# Patient Record
Sex: Male | Born: 1995 | Race: White | Hispanic: No | Marital: Married | Smoking: Never smoker
Health system: Southern US, Community
[De-identification: ages and names within clinical notes are randomized; demographics above are authoritative.]

## PROBLEM LIST (undated history)

## (undated) DIAGNOSIS — R0789 Other chest pain: Secondary | ICD-10-CM

## (undated) DIAGNOSIS — R202 Paresthesia of skin: Secondary | ICD-10-CM

## (undated) DIAGNOSIS — E669 Obesity, unspecified: Secondary | ICD-10-CM

## (undated) DIAGNOSIS — R7989 Other specified abnormal findings of blood chemistry: Secondary | ICD-10-CM

## (undated) HISTORY — DX: Obesity, unspecified: E66.9

## (undated) HISTORY — DX: Other chest pain: R07.89

## (undated) HISTORY — DX: Other specified abnormal findings of blood chemistry: R79.89

## (undated) HISTORY — DX: Paresthesia of skin: R20.2

---
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
## 2016-07-30 ENCOUNTER — Encounter: Payer: Self-pay | Admitting: Nurse Practitioner

## 2016-07-30 ENCOUNTER — Ambulatory Visit (INDEPENDENT_AMBULATORY_CARE_PROVIDER_SITE_OTHER): Payer: BC Managed Care – PPO | Admitting: Nurse Practitioner

## 2016-07-30 VITALS — BP 106/72 | Temp 98.7°F | Ht 71.0 in | Wt 235.1 lb

## 2016-07-30 DIAGNOSIS — J020 Streptococcal pharyngitis: Secondary | ICD-10-CM

## 2016-07-30 DIAGNOSIS — J029 Acute pharyngitis, unspecified: Secondary | ICD-10-CM

## 2016-07-30 LAB — POCT RAPID STREP A (OFFICE): RAPID STREP A SCREEN: POSITIVE — AB

## 2016-07-30 MED ORDER — AZITHROMYCIN 250 MG PO TABS: ORAL_TABLET | ORAL | 0 refills | Status: DC

## 2016-07-31 ENCOUNTER — Encounter: Payer: Self-pay | Admitting: Nurse Practitioner

## 2016-07-31 NOTE — Progress Notes (Signed)
Subjective:  Presents complaints of fever headache sore throat and swollen lymph nodes that began 2 days ago. Low-grade fever. Generalized headache. Minimal cough. No wheezing. No ear pain. Nausea, no vomiting or diarrhea. No abdominal pain, rare "tight feeling" in the left upper abdomen. Has felt knots in the right side of the neck, questions whether these are lymph nodes. No rash.  Objective:   BP 106/72   Temp 98.7 F (37.1 C) (Oral)   Ht 5\' 11"  (1.803 m)   Wt 235 lb 2 oz (106.7 kg)   BMI 32.79 kg/m  NAD. Alert, oriented. TMs minimal clear effusion, no erythema. Pharynx mild erythema. Neck supple with mild soft anterior adenopathy, 3 small soft mobile posterior cervical lymph nodes noted right side. Heart regular rate rhythm. Abdomen soft nondistended nontender without obvious organomegaly/splenomegaly. Results for orders placed or performed in visit on 07/30/16  POCT rapid strep A  Result Value Ref Range   Rapid Strep A Screen Positive (A) Negative     Assessment: Sore throat - Plan: POCT rapid strep A  Strep pharyngitis  Plan:  Meds ordered this encounter  Medications  . azithromycin (ZITHROMAX Z-PAK) 250 MG tablet    Sig: Take 2 tablets (500 mg) on  Day 1,  followed by 1 tablet (250 mg) once daily on Days 2 through 5.    Dispense:  6 each    Refill:  0    Order Specific Question:   Supervising Provider    Answer:   Merlyn Albert [[id number]]   Review warning signs. Call back in 72 hours if no improvement, sooner if worse. If symptoms persist, recommend testing for mono.

## 2017-07-17 ENCOUNTER — Ambulatory Visit (INDEPENDENT_AMBULATORY_CARE_PROVIDER_SITE_OTHER): Payer: BC Managed Care – PPO | Admitting: Nurse Practitioner

## 2017-07-17 ENCOUNTER — Ambulatory Visit: Payer: BC Managed Care – PPO | Admitting: Nurse Practitioner

## 2017-07-17 ENCOUNTER — Encounter: Payer: Self-pay | Admitting: Nurse Practitioner

## 2017-07-17 VITALS — BP 126/82 | HR 91 | Temp 99.7°F | Ht 71.0 in | Wt 245.1 lb

## 2017-07-17 DIAGNOSIS — K219 Gastro-esophageal reflux disease without esophagitis: Secondary | ICD-10-CM | POA: Diagnosis not present

## 2017-07-17 DIAGNOSIS — M7522 Bicipital tendinitis, left shoulder: Secondary | ICD-10-CM

## 2017-07-17 DIAGNOSIS — R079 Chest pain, unspecified: Secondary | ICD-10-CM | POA: Diagnosis not present

## 2017-07-17 MED ORDER — RANITIDINE HCL 300 MG PO TABS: ORAL_TABLET | ORAL | 5 refills | Status: DC

## 2017-07-17 NOTE — Progress Notes (Signed)
Subjective:  Presents for complaints of mid chest wall pain off-and-on for the past month. Occurs about 10 times per week. No specific pattern. Describes as a dull tightness with occasional sharp pain usually localized to the right mid lower chest wall rarely radiates over towards the left axillary area. Can last anywhere from 1:50 hours. Unassociated with meals or activity. Has not identified any specific triggers. No shortness of breath. No nausea vomiting. Has had some reflux and epigastric area pain at times. Minimal caffeine intake. Denies any form of tobacco use. No alcohol intake. No excessive NSAID use. No known family history of MI or heart disease. No relief with Tums. Has been doing normal activities without any chest discomfort or shortness of breath. [place] the zoo a few days ago and did tremendous walking without difficulty. Also complaints of tingling along the left arm from just above the elbow to the hand. No specific history of injury. Notices it more when he sitting in a car with his arm on the armrest for a length of time. Some relief with ibuprofen. No coughing. No edema. No fever.  Objective:   BP 126/82   Pulse 91   Temp 99.7 F (37.6 C) (Oral)   Ht 5\' 11"  (1.803 m)   Wt 245 lb 0.8 oz (111.2 kg)   SpO2 97%   BMI 34.18 kg/m  NAD. Alert, oriented. Lungs clear. Heart regular rate rhythm. No murmur or gallop noted. Abdomen soft nondistended nontender. No tenderness with palpation of the chest wall. EKG normal. Patient is not having discomfort at this time. Lower extremities no edema.  Assessment:   Problem List Items Addressed This Visit      Digestive   Gastroesophageal reflux disease without esophagitis   Relevant Medications   ranitidine (ZANTAC) 300 MG tablet    Other Visit Diagnoses    Chest pain, unspecified type    -  Primary   Biceps tendinitis of left shoulder           Plan:   Meds ordered this encounter  Medications  . ranitidine (ZANTAC) 300 MG tablet     Sig: One po qd for acid reflux    Dispense:  30 tablet    Refill:  5    Order Specific Question:   Supervising Provider    Answer:   Merlyn AlbertLUKING, WILLIAM S [[id number]]   Trial of Zantac once a day. Given written and verbal information on lifestyle factors affecting reflux disease. Warning signs reviewed including signs of ischemic heart disease. Patient to seek help immediately if symptoms worsen or change. Given copy of shoulder exercises. Use anti-inflammatory medication sparingly. Return in about 3 weeks (around 08/07/2017) for recheck.

## 2017-07-17 NOTE — Patient Instructions (Addendum)
Food Choices for Gastroesophageal Reflux Disease, Adult When you have gastroesophageal reflux disease (GERD), the foods you eat and your eating habits are very important. Choosing the right foods can help ease the discomfort of GERD. Consider working with a diet and nutrition specialist (dietitian) to help you make healthy food choices. What general guidelines should I follow? Eating plan  Choose healthy foods low in fat, such as fruits, vegetables, whole grains, low-fat dairy products, and lean meat, fish, and poultry.  Eat frequent, small meals instead of three large meals each day. Eat your meals slowly, in a relaxed setting. Avoid bending over or lying down until 2-3 hours after eating.  Limit high-fat foods such as fatty meats or fried foods.  Limit your intake of oils, butter, and shortening to less than 8 teaspoons each day.  Avoid the following: ? Foods that cause symptoms. These may be different for different people. Keep a food diary to keep track of foods that cause symptoms. ? Alcohol. ? Drinking large amounts of liquid with meals. ? Eating meals during the 2-3 hours before bed.  Cook foods using methods other than frying. This may include baking, grilling, or broiling. Lifestyle   Maintain a healthy weight. Ask your health care provider what weight is healthy for you. If you need to lose weight, work with your health care provider to do so safely.  Exercise for at least 30 minutes on 5 or more days each week, or as told by your health care provider.  Avoid wearing clothes that fit tightly around your waist and chest.  Do not use any products that contain nicotine or tobacco, such as cigarettes and e-cigarettes. If you need help quitting, ask your health care provider.  Sleep with the head of your bed raised. Use a wedge under the mattress or blocks under the bed frame to raise the head of the bed. What foods are not recommended? The items listed may not be a complete  list. Talk with your dietitian about what dietary choices are best for you. Grains Pastries or quick breads with added fat. French toast. Vegetables Deep fried vegetables. French fries. Any vegetables prepared with added fat. Any vegetables that cause symptoms. For some people this may include tomatoes and tomato products, chili peppers, onions and garlic, and horseradish. Fruits Any fruits prepared with added fat. Any fruits that cause symptoms. For some people this may include citrus fruits, such as oranges, grapefruit, pineapple, and lemons. Meats and other protein foods High-fat meats, such as fatty beef or pork, hot dogs, ribs, ham, sausage, salami and bacon. Fried meat or protein, including fried fish and fried chicken. Nuts and nut butters. Dairy Whole milk and chocolate milk. Sour cream. Cream. Ice cream. Cream cheese. Milk shakes. Beverages Coffee and tea, with or without caffeine. Carbonated beverages. Sodas. Energy drinks. Fruit juice made with acidic fruits (such as orange or grapefruit). Tomato juice. Alcoholic drinks. Fats and oils Butter. Margarine. Shortening. Ghee. Sweets and desserts Chocolate and cocoa. Donuts. Seasoning and other foods Pepper. Peppermint and spearmint. Any condiments, herbs, or seasonings that cause symptoms. For some people, this may include curry, hot sauce, or vinegar-based salad dressings. Summary  When you have gastroesophageal reflux disease (GERD), food and lifestyle choices are very important to help ease the discomfort of GERD.  Eat frequent, small meals instead of three large meals each day. Eat your meals slowly, in a relaxed setting. Avoid bending over or lying down until 2-3 hours after eating.  Limit high-fat   foods such as fatty meat or fried foods. This information is not intended to replace advice given to you by your health care provider. Make sure you discuss any questions you have with your health care provider. Document Released:  12/17/2005 Document Revised: 12/18/2016 Document Reviewed: 12/18/2016 Elsevier Interactive Patient Education  2017 Else[employer].  

## 2017-07-18 ENCOUNTER — Encounter: Payer: Self-pay | Admitting: Family Medicine

## 2017-08-09 ENCOUNTER — Encounter: Payer: Self-pay | Admitting: Nurse Practitioner

## 2017-08-09 ENCOUNTER — Ambulatory Visit (INDEPENDENT_AMBULATORY_CARE_PROVIDER_SITE_OTHER): Payer: BC Managed Care – PPO | Admitting: Nurse Practitioner

## 2017-08-09 VITALS — BP 118/80 | Ht 71.0 in | Wt 245.5 lb

## 2017-08-09 DIAGNOSIS — Z91018 Allergy to other foods: Secondary | ICD-10-CM | POA: Diagnosis not present

## 2017-08-09 DIAGNOSIS — K219 Gastro-esophageal reflux disease without esophagitis: Secondary | ICD-10-CM

## 2017-08-09 MED ORDER — PANTOPRAZOLE SODIUM 40 MG PO TBEC: 40.0000 mg | DELAYED_RELEASE_TABLET | Freq: Every day | ORAL | 2 refills | Status: DC

## 2017-08-09 NOTE — Progress Notes (Signed)
Subjective:  Presents for recheck on his chest pain see previous note. Symptoms overall have improved with less frequency. Last flareup was 3 days ago. No abdominal pain or reflux. Nonsmoker. Social alcohol use. Does drink coffee. No shortness of breath nausea or diaphoresis. Unassociated with activity. Unassociated with meals or particular foods as far as he can tell. No fever. No cough. No known history of tick bites or tick disease symptoms.  Objective:   BP 118/80   Ht $RemoveBefore [id number]$5\' 11" kg/m  NAD. Alert, oriented. Lungs clear. Heart regular rate rhythm. Abdomen soft nondistended with active bowel sounds 4; nontender to palpation. No obvious masses. No chest wall tenderness to palpation.  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 acetaminophen (TYLENOL) 500 MG tablet    Sig: Take 500 mg by mouth every 6 (six) hours as needed.  . pantoprazole (PROTONIX) 40 MG tablet    Sig: Take 1 tablet (40 mg total) by mouth daily.    Dispense:  30 tablet    Refill:  2    Order Specific Question:   Supervising Provider    Answer:   Merlyn AlbertLUKING, WILLIAM S [[id number]]   Hold on Zantac. Switch to pantoprazole for the next 4-6 weeks. Call back at that time if symptoms persist, sooner if worse. Lab test pending.

## 2017-08-14 LAB — ALPHA-GAL PANEL
BEEF (BOS SPP) IGE: 0.12 kU/L (ref <0.35)
Class Interpretation: 0
LAMB CLASS INTERPRETATION: 0
Lamb/Mutton (Ovis spp) IgE: <0.1 kU/L (ref <0.35)

## 2017-08-17 ENCOUNTER — Encounter: Payer: Self-pay | Admitting: Nurse Practitioner

## 2019-11-25 ENCOUNTER — Encounter (HOSPITAL_COMMUNITY): Payer: Self-pay

## 2019-11-25 ENCOUNTER — Ambulatory Visit (INDEPENDENT_AMBULATORY_CARE_PROVIDER_SITE_OTHER): Payer: BC Managed Care – PPO

## 2019-11-25 ENCOUNTER — Other Ambulatory Visit: Payer: Self-pay

## 2019-11-25 ENCOUNTER — Telehealth (HOSPITAL_COMMUNITY): Payer: Self-pay

## 2019-11-25 ENCOUNTER — Ambulatory Visit (HOSPITAL_COMMUNITY)
Admission: EM | Admit: 2019-11-25 | Discharge: 2019-11-25 | Disposition: A | Discharge status: Home / Self Care | Payer: BC Managed Care – PPO | Attending: Family Medicine | Admitting: Family Medicine

## 2019-11-25 DIAGNOSIS — R0981 Nasal congestion: Secondary | ICD-10-CM | POA: Diagnosis not present

## 2019-11-25 DIAGNOSIS — J069 Acute upper respiratory infection, unspecified: Secondary | ICD-10-CM

## 2019-11-25 DIAGNOSIS — R509 Fever, unspecified: Secondary | ICD-10-CM | POA: Insufficient documentation

## 2019-11-25 DIAGNOSIS — R05 Cough: Secondary | ICD-10-CM | POA: Insufficient documentation

## 2019-11-25 DIAGNOSIS — U071 COVID-19: Secondary | ICD-10-CM | POA: Diagnosis not present

## 2019-11-25 MED ORDER — HYDROCODONE-HOMATROPINE 5-1.5 MG/5ML PO SYRP: 5.0000 mL | ORAL_SOLUTION | Freq: Four times a day (QID) | ORAL | 0 refills | Status: DC | PRN

## 2019-11-25 NOTE — ED Provider Notes (Signed)
[facility]   [id number] 11/25/19 Arrival Time: 1609  ASSESSMENT & PLAN:  1. Viral URI with cough     I have personally viewed the imaging studies ordered this visit. No signs of pneumonia.  As needed: Meds ordered this encounter  Medications  . HYDROcodone-homatropine (HYCODAN) 5-1.5 MG/5ML syrup    Sig: Take 5 mLs by mouth every 6 (six) hours as needed for cough.    Dispense:  90 mL    Refill:  0    COVID testing sent. Discussed typical duration of symptoms. OTC symptom care as needed. Ensure adequate fluid intake and rest. May f/u with PCP or here as needed.  Reviewed expectations re: course of current medical issues. Questions answered. Outlined signs and symptoms indicating need for more acute intervention. Patient verbalized understanding. After Visit Summary given.   SUBJECTIVE: History from: patient.  Chase Hendricks is a 24 y.o. male who presents with complaint of nasal congestion, a persistent dry cough; without sore throat. Onset abrupt, 1.5 w ago; without fatigue and without body aches. SOB: none. Wheezing: none. Fever: initially; none recently. Overall normal PO intake without n/v. Known sick contacts or COVID-19 exposure: none. Tested negative for COVID approx 6 d ago. No specific or significant aggravating or alleviating factors reported. OTC treatment: NyQuil; mild relief. Cough does keep him up at night..   Social History   Tobacco Use  Smoking Status Never Smoker  Smokeless Tobacco Never Used    ROS: As per HPI. All other systems negative.    OBJECTIVE:  Vitals:   11/25/19 1647  BP: 118/81  Pulse: (!) 105  Resp: 18  Temp: 99.3 F (37.4 C)  TempSrc: Oral  SpO2: 97%    Recheck pulse: 96  General appearance: alert; appears fatigued HEENT: nasal congestion; clear runny nose; throat irritation secondary to post-nasal drainage Neck: supple without LAD CV: RRR Lungs: unlabored respirations, symmetrical air entry without  wheezing; cough: mild and dry Abd: soft Ext: no LE edema Skin: warm and dry Psychological: alert and cooperative; normal mood and affect  Imaging: Dg Chest 2 View  Result Date: 11/25/2019 CLINICAL DATA:  Fever and cough.  COVID negative. EXAM: CHEST - 2 VIEW COMPARISON:  Chest x-ray dated 09/24/2007 FINDINGS: The heart size and mediastinal contours are within normal limits. Both lungs are clear. The visualized skeletal structures are unremarkable. IMPRESSION: Normal exam. Electronically Signed   By: Francene Boyers M.D.   On: 11/25/2019 17:40    No Known Allergies  PMH: GERD  FH: HTN  Social History   Socioeconomic History  . Marital status: Single    Spouse name: Not on file  . Number of children: Not on file  . Years of education: Not on file  . Highest education level: Not on file  Occupational History  . Not on file  Social Needs  . Financial resource strain: Not on file  . Food insecurity    Worry: Not on file    Inability: Not on file  . Transportation needs    Medical: Not on file    Non-medical: Not on file  Tobacco Use  . Smoking status: Never Smoker  . Smokeless tobacco: Never Used  Substance and Sexual Activity  . Alcohol use: Yes    Comment: rare social  . Drug use: No  . Sexual activity: Never  Lifestyle  . Physical activity    Days per week: Not on file    Minutes per session: Not on file  .  Stress: Not on file  Relationships  . Social Herbalist on phone: Not on file    Gets together: Not on file    Attends religious service: Not on file    Active member of club or organization: Not on file    Attends meetings of clubs or organizations: Not on file    Relationship status: Not on file  . Intimate partner violence    Fear of current or ex partner: Not on file    Emotionally abused: Not on file    Physically abused: Not on file    Forced sexual activity: Not on file  Other Topics Concern  . Not on file  Social History Narrative  .  Not on file           Vanessa Kick, MD 11/25/19 [phone]

## 2019-11-25 NOTE — ED Triage Notes (Signed)
Pt states having cough, nasal congestion, fever, chills, headache x 1 week and a half. Pt report he tested negative for Covid on 11/19/2019

## 2019-11-25 NOTE — Discharge Instructions (Signed)
If your Covid-19 test is positive, you will get a phone call from [facility] regarding your results. If your Covid-19 test is negative, you will NOT get a phone call from [facility] with your results. You may view your results on MyChart. If you do not have a MyChart account, sign up instructions are in your discharge papers. ° °

## 2019-11-27 LAB — NOVEL CORONAVIRUS, NAA (HOSP ORDER, SEND-OUT TO REF LAB; TAT 18-24 HRS): SARS-CoV-2, NAA: DETECTED — AB

## 2019-11-29 ENCOUNTER — Encounter (HOSPITAL_COMMUNITY): Payer: Self-pay

## 2019-11-29 ENCOUNTER — Telehealth (HOSPITAL_COMMUNITY): Payer: Self-pay | Admitting: Emergency Medicine

## 2019-11-29 NOTE — Telephone Encounter (Signed)

## 2020-10-26 IMAGING — DX DG CHEST 2V
2 series · 2 of 2 positions shown · non-contrast
Comparison: Chest x-ray dated 09/24/2007

CLINICAL DATA: Fever and cough.  COVID negative.

EXAM:
CHEST - 2 VIEW

[chest pa]
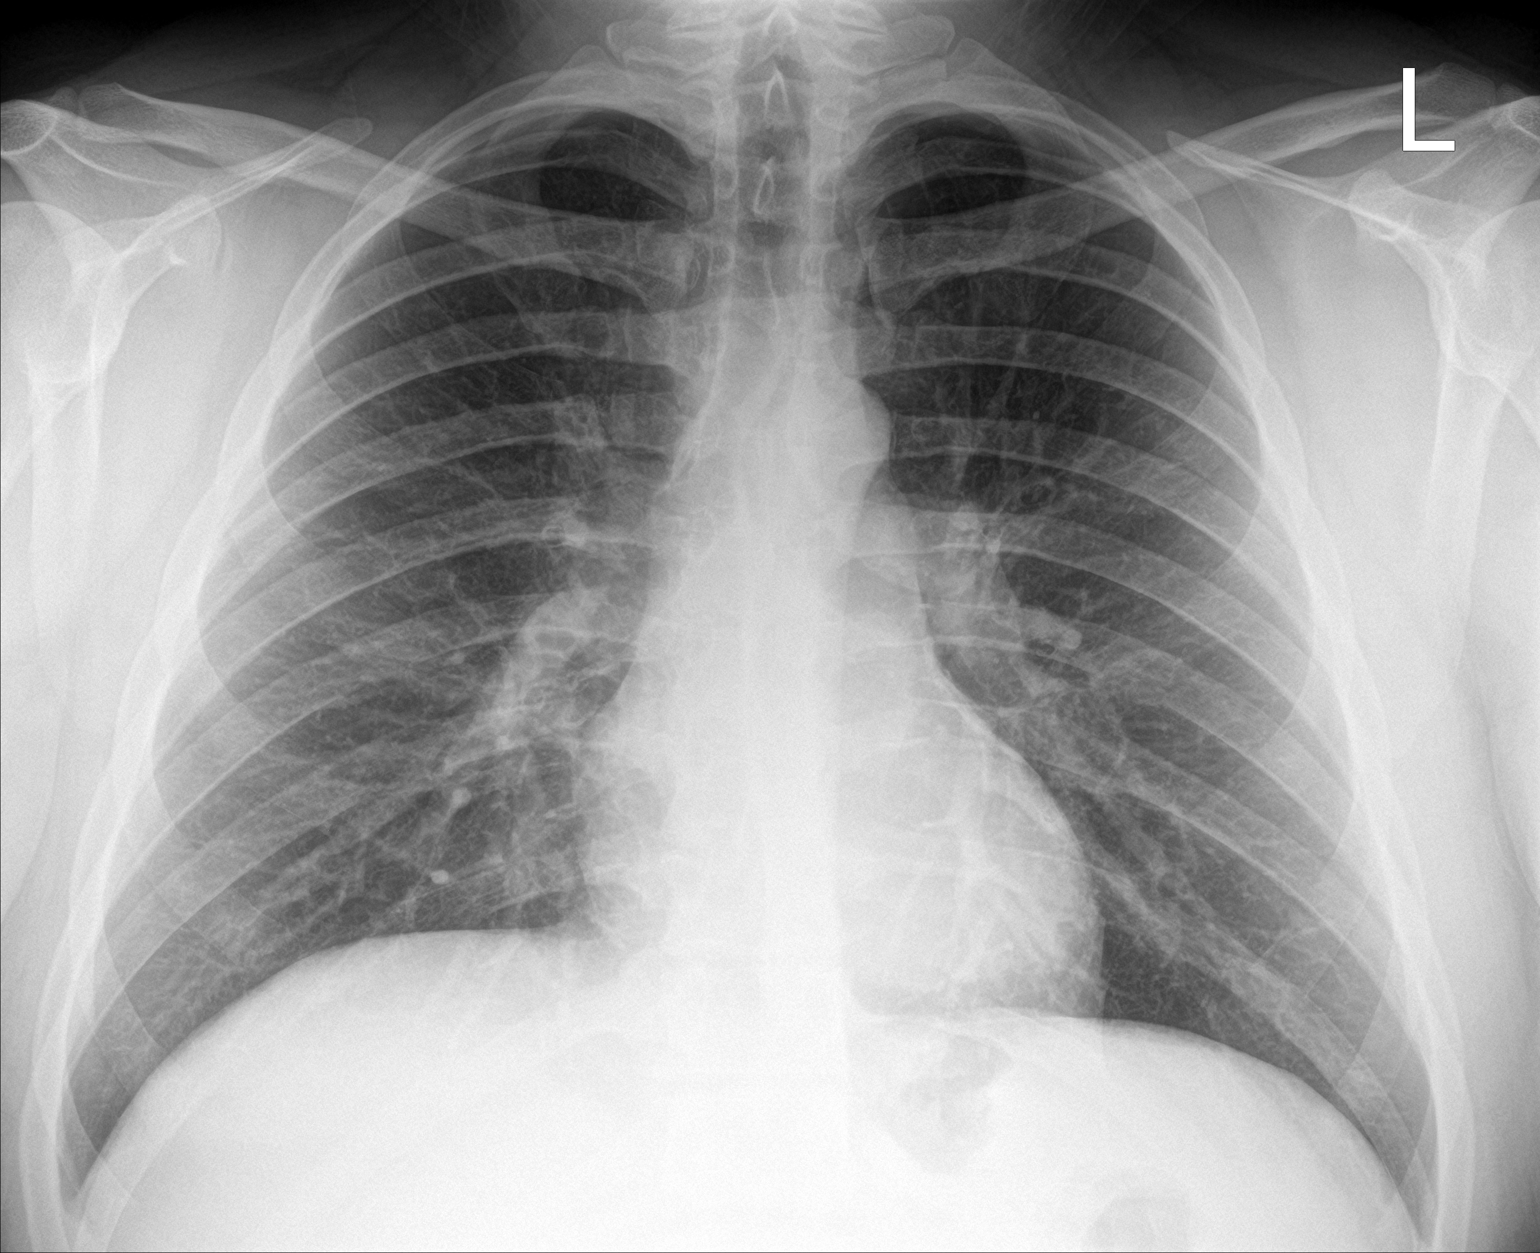

[chest lat]
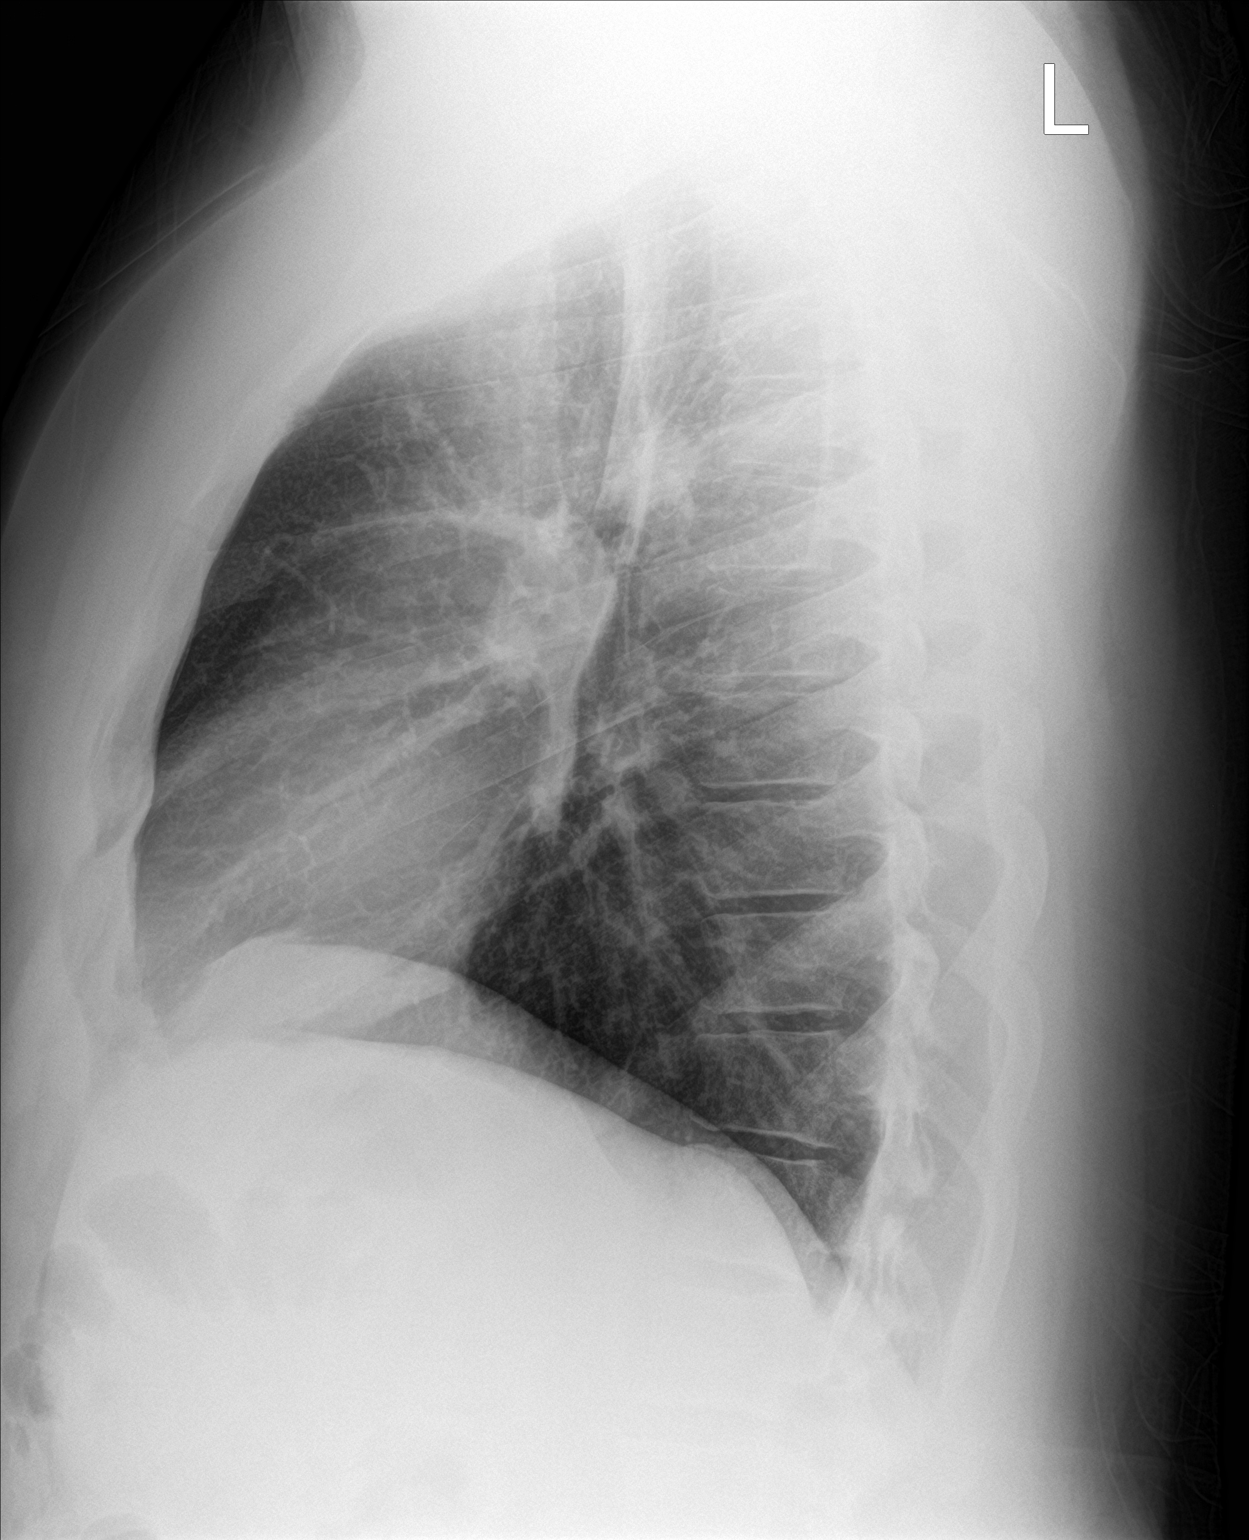

[2 of 2 positions shown; findings below may reference images not displayed]

FINDINGS: The heart size and mediastinal contours are within normal limits.
Both lungs are clear. The visualized skeletal structures are
unremarkable.
IMPRESSION: Normal exam.

## 2021-11-09 ENCOUNTER — Other Ambulatory Visit: Payer: Self-pay | Admitting: Family Medicine

## 2021-11-09 DIAGNOSIS — R222 Localized swelling, mass and lump, trunk: Secondary | ICD-10-CM

## 2021-12-27 ENCOUNTER — Other Ambulatory Visit: Payer: BC Managed Care – PPO

## 2022-03-23 ENCOUNTER — Encounter (HOSPITAL_COMMUNITY): Payer: Self-pay | Admitting: Emergency Medicine

## 2022-03-23 ENCOUNTER — Emergency Department (HOSPITAL_COMMUNITY): Payer: BC Managed Care – PPO

## 2022-03-23 ENCOUNTER — Other Ambulatory Visit: Payer: Self-pay

## 2022-03-23 ENCOUNTER — Emergency Department (HOSPITAL_COMMUNITY)
Admission: EM | Admit: 2022-03-23 | Discharge: 2022-03-23 | Disposition: A | Discharge status: Home / Self Care | Payer: BC Managed Care – PPO | Attending: Emergency Medicine | Admitting: Emergency Medicine

## 2022-03-23 DIAGNOSIS — Z8719 Personal history of other diseases of the digestive system: Secondary | ICD-10-CM | POA: Insufficient documentation

## 2022-03-23 DIAGNOSIS — R072 Precordial pain: Secondary | ICD-10-CM | POA: Diagnosis present

## 2022-03-23 DIAGNOSIS — F439 Reaction to severe stress, unspecified: Secondary | ICD-10-CM | POA: Insufficient documentation

## 2022-03-23 DIAGNOSIS — R079 Chest pain, unspecified: Secondary | ICD-10-CM

## 2022-03-23 LAB — CBC
HCT: 47.6 % (ref 39.0–52.0)
Hemoglobin: 16.7 g/dL (ref 13.0–17.0)
MCH: 30.8 pg (ref 26.0–34.0)
MCHC: 35.1 g/dL (ref 30.0–36.0)
MCV: 87.7 fL (ref 80.0–100.0)
Platelets: 305 10*3/uL (ref 150–400)
RBC: 5.43 MIL/uL (ref 4.22–5.81)
RDW: 12.1 % (ref 11.5–15.5)
WBC: 10.9 10*3/uL — ABNORMAL HIGH (ref 4.0–10.5)
nRBC: 0 % (ref 0.0–0.2)

## 2022-03-23 LAB — BASIC METABOLIC PANEL
Anion gap: 10 (ref 5–15)
BUN: 11 mg/dL (ref 6–20)
CO2: 24 mmol/L (ref 22–32)
Calcium: 9.3 mg/dL (ref 8.9–10.3)
Chloride: 102 mmol/L (ref 98–111)
Creatinine, Ser: 1.06 mg/dL (ref 0.61–1.24)
GFR, Estimated: >60 mL/min (ref >60)
Glucose, Bld: 104 mg/dL — ABNORMAL HIGH (ref 70–99)
Potassium: 3.8 mmol/L (ref 3.5–5.1)
Sodium: 136 mmol/L (ref 135–145)

## 2022-03-23 LAB — HEPATIC FUNCTION PANEL
ALT: 45 U/L — ABNORMAL HIGH (ref 0–44)
AST: 31 U/L (ref 15–41)
Albumin: 4.5 g/dL (ref 3.5–5.0)
Alkaline Phosphatase: 96 U/L (ref 38–126)
Bilirubin, Direct: <0.1 mg/dL (ref 0.0–0.2)
Total Bilirubin: 0.9 mg/dL (ref 0.3–1.2)
Total Protein: 7.7 g/dL (ref 6.5–8.1)

## 2022-03-23 LAB — LIPASE, BLOOD: Lipase: 24 U/L (ref 11–51)

## 2022-03-23 LAB — TROPONIN I (HIGH SENSITIVITY)
Troponin I (High Sensitivity): 7 ng/L (ref <18)
Troponin I (High Sensitivity): 6 ng/L (ref <18)

## 2022-03-23 MED ORDER — ALUM & MAG HYDROXIDE-SIMETH 200-200-20 MG/5ML PO SUSP
30.0000 mL | Freq: Once | ORAL | Status: AC
  Administered 2022-03-23: 30 mL via ORAL
  Filled 2022-03-23: qty 30

## 2022-03-23 MED ORDER — MAALOX MAX 400-400-40 MG/5ML PO SUSP: 10.0000 mL | Freq: Three times a day (TID) | ORAL | 0 refills | Status: AC | PRN

## 2022-03-23 MED ORDER — FAMOTIDINE 40 MG/5ML PO SUSR
20.0000 mg | Freq: Once | ORAL | Status: AC
  Administered 2022-03-23: 20 mg via ORAL
  Filled 2022-03-23: qty 2.5

## 2022-03-23 MED ORDER — FAMOTIDINE 10 MG PO TABS: 10.0000 mg | ORAL_TABLET | Freq: Two times a day (BID) | ORAL | 0 refills | Status: DC

## 2022-03-23 NOTE — ED Provider Triage Note (Signed)
Emergency Medicine Provider Triage Evaluation Note ? ?Chase Hendricks , a 27 y.o. male  was evaluated in triage.  Pt complains of chest pain since Tuesday. Pain in center of chest. Sometimes radiates down left arm or to neck. Pain is intermittent.  Feels sharp. No associated with exertion. Nothing has made it better or worse. He has associated dizziness. Asymptomatic now except for some left arm tingling and heaviness. No shortness of breath, nausea, or vomiting.  ? ?Review of Systems  ?Positive: Chest pain ?Negative:  ? ?Physical Exam  ?BP (!) 158/90 (BP Location: Right Arm)   Pulse 98   Temp 99.6 ?F (37.6 ?C) (Oral)   Resp 16   Ht 6' (1.829 m)   Wt (!) 145.2 kg   SpO2 99%   BMI 43.40 kg/m?  ?Gen:   Awake, no distress   ?Resp:  Normal effort  ?MSK:   Moves extremities without difficulty  ?Other:   ? ?Medical Decision Making  ?Medically screening exam initiated at 1:49 PM.  Appropriate orders placed.  Chase Hendricks was informed that the remainder of the evaluation will be completed by another provider, this initial triage assessment does not replace that evaluation, and the importance of remaining in the ED until their evaluation is complete. ? ?Chest pain workup ?  ?Claudie Leach, PA-C ?03/23/22 1351 ? ?

## 2022-03-23 NOTE — ED Triage Notes (Signed)
Pt arrived POV from home c/o sharp centralized CP and sharp pains in his left arm since Tuesday. Pt also states he has had a headache on and off since Tuesday as well.  ?

## 2022-03-23 NOTE — ED Provider Notes (Signed)
?[facility] EMERGENCY DEPARTMENT ?Provider Note ? ? ?CSN: [id number] ?Arrival date & time: 03/23/22  1328 ? ?  ? ?History ? ?Chief Complaint  ?Patient presents with  ? Headache  ?   ?  ? Chest Pain  ?   ?  ? ? ?Chase Hendricks is a 27 y.o. male. ? ?Pt is a 27 yo male presenting for chest pain. Pt admits to chest pain at the left sternal border with radiating down the left arm, sharp, intermittently, x 4 days. Pt has hx of GERD. Admits to increased stress. No sob. Denies fevers, chills, or coughing.  ? ?The history is provided by the patient. No language interpreter was used.  ?Headache ?Associated symptoms: no abdominal pain, no back pain, no cough, no ear pain, no eye pain, no fever, no seizures, no sore throat and no vomiting   ?Chest Pain ?Associated symptoms: headache   ?Associated symptoms: no abdominal pain, no back pain, no cough, no fever, no palpitations, no shortness of breath and no vomiting   ? ?  ? ?Home Medications ?Prior to Admission medications   ?Medication Sig Start Date End Date Taking? Authorizing Provider  ?acetaminophen (TYLENOL) 500 MG tablet Take 500 mg by mouth every 6 (six) hours as needed.    [provider]  ?HYDROcodone-homatropine (HYCODAN) 5-1.5 MG/5ML syrup Take 5 mLs by mouth every 6 (six) hours as needed for cough. 11/25/19   Mardella Layman, MD  ?ibuprofen (ADVIL,MOTRIN) 200 MG tablet Take 200 mg by mouth every 6 (six) hours as needed.    [provider]  ?loratadine (CLARITIN) 10 MG tablet Take 10 mg by mouth as needed.    [provider]  ?pantoprazole (PROTONIX) 40 MG tablet Take 1 tablet (40 mg total) by mouth daily. 08/09/17   Campbell Riches, NP  ?Pseudoeph-Doxylamine-DM-APAP (DAYQUIL/NYQUIL COLD/FLU RELIEF PO) Take by mouth.    [provider]  ?   ? ?Allergies    ?Eggs or egg-derived products   ? ?Review of Systems   ?Review of Systems  ?Constitutional:  Negative for chills and fever.  ?HENT:  Negative for ear pain and  sore throat.   ?Eyes:  Negative for pain and visual disturbance.  ?Respiratory:  Negative for cough and shortness of breath.   ?Cardiovascular:  Positive for chest pain. Negative for palpitations.  ?Gastrointestinal:  Negative for abdominal pain and vomiting.  ?Genitourinary:  Negative for dysuria and hematuria.  ?Musculoskeletal:  Negative for arthralgias and back pain.  ?Skin:  Negative for color change and rash.  ?Neurological:  Positive for headaches. Negative for seizures and syncope.  ?All other systems reviewed and are negative. ? ?Physical Exam ?Updated Vital Signs ?BP (!) 149/72   Pulse 89   Temp 98.5 ?F (36.9 ?C) (Oral)   Resp 18   Ht 6' (1.829 m)   Wt (!) 145.2 kg   SpO2 96%   BMI 43.40 kg/m?  ?Physical Exam ?Vitals and nursing note reviewed.  ?Constitutional:   ?   General: He is not in acute distress. ?   Appearance: He is well-developed.  ?HENT:  ?   Head: Normocephalic and atraumatic.  ?Eyes:  ?   Conjunctiva/sclera: Conjunctivae normal.  ?Cardiovascular:  ?   Rate and Rhythm: Normal rate and regular rhythm.  ?   Heart sounds: No murmur heard. ?Pulmonary:  ?   Effort: Pulmonary effort is normal. No respiratory distress.  ?   Breath sounds: Normal breath sounds.  ?Abdominal:  ?  Palpations: Abdomen is soft.  ?   Tenderness: There is no abdominal tenderness.  ?Musculoskeletal:     ?   General: No swelling.  ?   Cervical back: Neck supple.  ?Skin: ?   General: Skin is warm and dry.  ?   Capillary Refill: Capillary refill takes less than 2 seconds.  ?Neurological:  ?   Mental Status: He is alert and oriented to person, place, and time.  ?   GCS: GCS eye subscore is 4. GCS verbal subscore is 5. GCS motor subscore is 6.  ?Psychiatric:     ?   Mood and Affect: Mood normal.  ? ? ?ED Results / Procedures / Treatments   ?Labs ?(all labs ordered are listed, but only abnormal results are displayed) ?Labs Reviewed  ?BASIC METABOLIC PANEL - Abnormal; Notable for the following components:  ?    Result Value   ? Glucose, Bld 104 (*)   ? All other components within normal limits  ?CBC - Abnormal; Notable for the following components:  ? WBC 10.9 (*)   ? All other components within normal limits  ?HEPATIC FUNCTION PANEL - Abnormal; Notable for the following components:  ? ALT 45 (*)   ? All other components within normal limits  ?LIPASE, BLOOD  ?TROPONIN I (HIGH SENSITIVITY)  ?TROPONIN I (HIGH SENSITIVITY)  ? ? ?EKG ?EKG Interpretation ? ?Date/Time:  Friday March 23 2022 13:31:38 EDT ?Ventricular Rate:  101 ?PR Interval:  148 ?QRS Duration: 92 ?QT Interval:  330 ?QTC Calculation: 427 ?R Axis:   77 ?Text Interpretation: Sinus tachycardia Otherwise normal ECG No previous ECGs available Confirmed by Edwin Dada ([id number]) on 03/23/2022 7:32:45 PM ? ?Radiology ?DG Chest 2 View ? ?Result Date: 03/23/2022 ?CLINICAL DATA:  Chest pain.  Mid chest pain.  Left arm pain. EXAM: CHEST - 2 VIEW COMPARISON:  Chest two views 11/25/2019 FINDINGS: Cardiac silhouette and mediastinal contours are within normal limits. The lungs are clear. No pleural effusion or pneumothorax. No acute skeletal abnormality. IMPRESSION: No active cardiopulmonary disease. Electronically Signed   By: Neita Garnet M.D.   On: 03/23/2022 14:28   ? ?Procedures ?Procedures  ? ? ?Medications Ordered in ED ?Medications - No data to display ? ?ED Course/ Medical Decision Making/ A&P ?  ?                        ?Medical Decision Making ?Amount and/or Complexity of Data Reviewed ?Labs: ordered. ?Radiology: ordered. ? ?Risk ?OTC drugs. ?Prescription drug management. ? ? ?7:31 PM ?27 yo male presenting for chest pain. The patient's chest pain is not suggestive of pulmonary embolus, cardiac ischemia, aortic dissection, pericarditis, myocarditis, pulmonary embolism, pneumothorax, pneumonia, Zoster, or esophageal perforation, or other serious etiology.  Historically not abrupt in onset, tearing or ripping, pulses symmetric. EKG nonspecific for ischemia/infarction. No dysrhythmias,  brugada, WPW, prolonged QT noted. CXR reviewed and WNL. Troponin negative. Labs without demonstration of acute pathology unless otherwise noted above.  ? ?Patient has history of GERD multiple attempts of omeprazole use.  Pepcid and Maalox given in the emergency department.  Recommended for GI follow-up symptoms continue.  Prescription sent to pharmacy. ? ?Patient in no distress and overall condition improved here in the ED. Detailed discussions were had with the patient regarding current findings, and need for close f/u with PCP or on call doctor. The patient has been instructed to return immediately if the symptoms worsen in any way for re-evaluation. Patient verbalized understanding  and is in agreement with current care plan. All questions answered prior to discharge.  ? ? ? ? ? ? ? ?Final Clinical Impression(s) / ED Diagnoses ?Final diagnoses:  ?Chest pain, unspecified type  ?H/O gastroesophageal reflux (GERD)  ? ? ?Rx / DC Orders ?ED Discharge Orders   ? ? None  ? ?  ? ? ?  ?Franne FortsGray, Jiovani Mccammon P, DO ?03/23/22 2116 ? ?

## 2022-03-23 NOTE — ED Notes (Signed)
Pt verbalized understanding of d/c instructions, meds, and followup care. Denies questions. VSS, no distress noted. Steady gait to exit with all belongings.  ?

## 2022-06-08 DIAGNOSIS — R202 Paresthesia of skin: Secondary | ICD-10-CM | POA: Diagnosis not present

## 2022-07-08 DIAGNOSIS — L237 Allergic contact dermatitis due to plants, except food: Secondary | ICD-10-CM | POA: Diagnosis not present

## 2022-08-13 DIAGNOSIS — N50819 Testicular pain, unspecified: Secondary | ICD-10-CM | POA: Diagnosis not present

## 2022-08-13 DIAGNOSIS — M62838 Other muscle spasm: Secondary | ICD-10-CM | POA: Diagnosis not present

## 2022-08-13 DIAGNOSIS — R202 Paresthesia of skin: Secondary | ICD-10-CM | POA: Diagnosis not present

## 2022-08-14 ENCOUNTER — Other Ambulatory Visit (HOSPITAL_BASED_OUTPATIENT_CLINIC_OR_DEPARTMENT_OTHER): Payer: Self-pay | Admitting: Family Medicine

## 2022-08-14 ENCOUNTER — Encounter: Payer: Self-pay | Admitting: Neurology

## 2022-08-14 DIAGNOSIS — N50812 Left testicular pain: Secondary | ICD-10-CM

## 2022-08-17 ENCOUNTER — Ambulatory Visit (HOSPITAL_BASED_OUTPATIENT_CLINIC_OR_DEPARTMENT_OTHER): Admission: RE | Admit: 2022-08-17 | Payer: BC Managed Care – PPO | Source: Ambulatory Visit

## 2022-08-24 ENCOUNTER — Ambulatory Visit (HOSPITAL_BASED_OUTPATIENT_CLINIC_OR_DEPARTMENT_OTHER): Admission: RE | Admit: 2022-08-24 | Payer: BC Managed Care – PPO | Source: Ambulatory Visit

## 2022-08-24 ENCOUNTER — Encounter (HOSPITAL_BASED_OUTPATIENT_CLINIC_OR_DEPARTMENT_OTHER): Payer: Self-pay

## 2022-09-05 ENCOUNTER — Other Ambulatory Visit: Payer: Self-pay | Admitting: Family Medicine

## 2022-09-05 DIAGNOSIS — N50819 Testicular pain, unspecified: Secondary | ICD-10-CM

## 2022-09-06 ENCOUNTER — Ambulatory Visit
Admission: RE | Admit: 2022-09-06 | Discharge: 2022-09-06 | Disposition: A | Discharge status: Home / Self Care | Payer: BC Managed Care – PPO | Source: Ambulatory Visit | Attending: Family Medicine | Admitting: Family Medicine

## 2022-09-06 DIAGNOSIS — N50819 Testicular pain, unspecified: Secondary | ICD-10-CM

## 2022-09-06 DIAGNOSIS — N503 Cyst of epididymis: Secondary | ICD-10-CM | POA: Diagnosis not present

## 2022-09-10 ENCOUNTER — Encounter: Payer: Self-pay | Admitting: Neurology

## 2022-09-10 ENCOUNTER — Ambulatory Visit (INDEPENDENT_AMBULATORY_CARE_PROVIDER_SITE_OTHER): Payer: BC Managed Care – PPO | Admitting: Neurology

## 2022-09-10 VITALS — BP 142/82 | HR 86 | Ht 72.0 in | Wt 314.0 lb

## 2022-09-10 DIAGNOSIS — R202 Paresthesia of skin: Secondary | ICD-10-CM | POA: Diagnosis not present

## 2022-09-10 NOTE — Patient Instructions (Signed)
We will order nerve testing of the legs  ELECTROMYOGRAM AND NERVE CONDUCTION STUDIES (EMG/NCS) INSTRUCTIONS  How to Prepare The neurologist conducting the EMG will need to know if you have certain medical conditions. Tell the neurologist and other EMG lab personnel if you: Have a pacemaker or any other electrical medical device Take blood-thinning medications Have hemophilia, a blood-clotting disorder that causes prolonged bleeding Bathing Take a shower or bath shortly before your exam in order to remove oils from your skin. Don't apply lotions or creams before the exam.  What to Expect You'll likely be asked to change into a hospital gown for the procedure and lie down on an examination table. The following explanations can help you understand what will happen during the exam.  Electrodes. The neurologist or a technician places surface electrodes at various locations on your skin depending on where you're experiencing symptoms. Or the neurologist may insert needle electrodes at different sites depending on your symptoms.  Sensations. The electrodes will at times transmit a tiny electrical current that you may feel as a twinge or spasm. The needle electrode may cause discomfort or pain that usually ends shortly after the needle is removed. If you are concerned about discomfort or pain, you may want to talk to the neurologist about taking a short break during the exam.  Instructions. During the needle EMG, the neurologist will assess whether there is any spontaneous electrical activity when the muscle is at rest - activity that isn't present in healthy muscle tissue - and the degree of activity when you slightly contract the muscle.  He or she will give you instructions on resting and contracting a muscle at appropriate times. Depending on what muscles and nerves the neurologist is examining, he or she may ask you to change positions during the exam.  After your EMG You may experience some  temporary, minor bruising where the needle electrode was inserted into your muscle. This bruising should fade within several days. If it persists, contact your primary care doctor.

## 2022-09-10 NOTE — Progress Notes (Signed)
[facility] Neu[facility] Division Clinic Note - Initial Visit   Date: 09/10/2022   Chase Hendricks MRN: [id number] DOB: [birth year]   Dear Dr. Kateri Plummer:  Thank you for your kind referral of Chase Hendricks for consultation of left foot numbness. Although his history is well known to you, please allow [place] to reiterate it for the purpose of our medical record. The patient was accompanied to the clinic by self.    Chase Hendricks is a 27 y.o. right-handed male presenting for evaluation of numbness of the left foot.   IMPRESSION/PLAN: Left > right foot paresthesias, symptoms are very localized and do not fit a nerve distribution.  No findings of neuropathy on exam.  ?S1 radiculopathy.  To be complete, I will check NCS/EMG of the left > right leg.  Further recommendations pending results.  ------------------------------------------------------------- History of present illness: Starting around 2021, he began having intermittent spells of numbness/tingling involving the left medial arch but over 72-month, it has become more constant.  He notices it more when sitting.  He has intermittent numbness over the top of his 2nd toe on the right.  He denies low back pain and had rare spells of shooting pain in the left leg.   He had left foot surgery in 2013 due to tendon injury.  She had numbness over the dorsum of the foot where the surgery scar.    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    Out-side paper records, electronic medical record, and images have been reviewed where available and summarized as:  n/a  History reviewed. No pertinent past medical history.  Past Surgical History:  Procedure Laterality Date   left foot surgery   2013   Torn tendon     Medications:  Outpatient Encounter Medications as of 09/10/2022  Medication Sig   acetaminophen (TYLENOL) 500 MG tablet Take 500 mg by mouth every 6 (six) hours as needed.   alum & mag hydroxide-simeth (MAALOX MAX)  400-400-40 MG/5ML suspension Take 10 mLs by mouth every 8 (eight) hours as needed for indigestion (heart burn).   cetirizine (ZYRTEC) 10 MG tablet Take 10 mg by mouth daily.   famotidine (PEPCID) 10 MG tablet Take 1 tablet (10 mg total) by mouth 2 (two) times daily for 15 days.   ibuprofen (ADVIL,MOTRIN) 200 MG tablet Take 200 mg by mouth every 6 (six) hours as needed.   Pseudoeph-Doxylamine-DM-APAP (DAYQUIL/NYQUIL COLD/FLU RELIEF PO) Take by mouth. prn   loratadine (CLARITIN) 10 MG tablet Take 10 mg by mouth as needed. (Patient not taking: Reported on 09/10/2022)   pantoprazole (PROTONIX) 40 MG tablet Take 1 tablet (40 mg total) by mouth daily. (Patient not taking: Reported on 09/10/2022)   [DISCONTINUED] HYDROcodone-homatropine (HYCODAN) 5-1.5 MG/5ML syrup Take 5 mLs by mouth every 6 (six) hours as needed for cough. (Patient not taking: Reported on 09/10/2022)   No facility-administered encounter medications on file as of 09/10/2022.    Allergies:  Allergies  Allergen Reactions   Eggs Or Egg-Derived Products Other (See Comments)    Intolerance     Family History: Family History  Problem Relation Age of Onset   Breast cancer Mother    Hypertension Father    Other Father        meralgia paresthetica & Hip Replacement    Social History: Social History   Tobacco Use   Smoking status: Never   Smokeless tobacco: Never  Substance Use Topics   Alcohol use: Yes    Comment:  rare social   Drug use: No   Social History   Social History Narrative   Right Handed    Lives in two story home     Vital Signs:  BP (!) 142/82   Pulse 86   Ht 6' (1.829 m)   Wt (!) 314 lb (142.4 kg)   SpO2 98%   BMI 42.59 kg/m   Neurological Exam: MENTAL STATUS including orientation to time, place, person, recent and remote memory, attention span and concentration, language, and fund of knowledge is normal.  Speech is not dysarthric.  CRANIAL NERVES: II:  No visual field defects.   III-IV-VI:  Pupils equal round and reactive to light.  Normal conjugate, extra-ocular eye movements in all directions of gaze.  No nystagmus.  No ptosis.   V:  Normal facial sensation.    VII:  Normal facial symmetry and movements.   VIII:  Normal hearing and vestibular function.   IX-X:  Normal palatal movement.   XI:  Normal shoulder shrug and head rotation.   XII:  Normal tongue strength and range of motion, no deviation or fasciculation.  MOTOR:  Motor strength is 5/5 throughout, including distally in the feet. No atrophy, fasciculations or abnormal movements.  No pronator drift.   MSRs:  Right        Left                  brachioradialis 2+  2+  biceps 2+  2+  triceps 2+  2+  patellar 2+  2+  ankle jerk 2+  2+  Hoffman no  no  plantar response down  down   SENSORY:  Normal and symmetric perception of light touch, pinprick, vibration, and proprioception.     COORDINATION/GAIT: Normal finger-to- nose-finger.  Intact rapid alternating movements bilaterally.  Gait narrow based and stable. Tandem and stressed gait intact.    Thank you for allowing me to participate in patient's care.  If I can answer any additional questions, I would be pleased to do so.    Sincerely,    Cicily Bonano K. Allena Katz, DO

## 2022-09-24 ENCOUNTER — Ambulatory Visit: Payer: BC Managed Care – PPO | Admitting: Neurology

## 2022-09-25 ENCOUNTER — Encounter: Payer: Self-pay | Admitting: Neurology

## 2022-09-28 DIAGNOSIS — E785 Hyperlipidemia, unspecified: Secondary | ICD-10-CM | POA: Diagnosis not present

## 2022-09-28 DIAGNOSIS — Z0000 Encounter for general adult medical examination without abnormal findings: Secondary | ICD-10-CM | POA: Diagnosis not present

## 2022-09-28 DIAGNOSIS — R7309 Other abnormal glucose: Secondary | ICD-10-CM | POA: Diagnosis not present

## 2022-09-28 DIAGNOSIS — Z13220 Encounter for screening for lipoid disorders: Secondary | ICD-10-CM | POA: Diagnosis not present

## 2022-10-09 ENCOUNTER — Ambulatory Visit (INDEPENDENT_AMBULATORY_CARE_PROVIDER_SITE_OTHER): Payer: BC Managed Care – PPO | Admitting: Neurology

## 2022-10-09 DIAGNOSIS — R202 Paresthesia of skin: Secondary | ICD-10-CM

## 2022-10-09 NOTE — Procedures (Signed)
[facility] Neurology  [address], [address]  [place], [place] [address] Tel: [phone] Fax: [phone] Test Date:  10/09/2022  Patient: Chase Hendricks DOB: 1995-09-30 Physician: Nita Sickle, DO  Sex: Male Height: 6\' 0"  Ref Phys: , DO  ID#: Nita Sickle   Technician:    History: This is a 27 year old man referred for evaluation of bilateral feet pain.  NCV & EMG Findings: Electrodiagnostic testing of the right lower extremity and additional studies of the left shows: Bilateral sural and superficial peroneal sensory responses are within normal limits. Bilateral peroneal and tibial motor responses are within normal limits. Bilateral tibial H reflex studies are within normal limits. There is no evidence of active or chronic motor axonal changes affecting any of the tested muscles.  Motor unit configuration and recruitment pattern is within normal limits.   Impression: This is a normal study of the lower extremities.  In particular, there is no evidence of a large fiber sensorimotor polyneuropathy or lumbosacral radiculopathy.      ___________________________ 30, DO    Nerve Conduction Studies Motor Nerve Results    Latency Amplitude F-Lat Segment Distance CV Comment  Site (ms) Norm (mV) Norm (ms)  (cm) (m/s) Norm   Left Fibular (EDB) Motor  Ankle 3.8  < 5.5 7.6  > 3.0        Bel fib head 11.5 - 7.6 -  Bel fib head-Ankle 38 49  > 41   Pop fossa 13.3 - 7.0 -  Pop fossa-Bel fib head 10 56 -   Right Fibular (EDB) Motor  Ankle 3.5  < 5.5 11.1  > 3.0        Bel fib head 11.5 - 9.6 -  Bel fib head-Ankle 41 51  > 41   Pop fossa 13.2 - 9.5 -  Pop fossa-Bel fib head 10 59 -   Left Tibial (AH) Motor  Ankle 3.7  < 5.8 9.7  > 8.0        Knee 13.4 - 8.4 -  Knee-Ankle 44 45  > 41   Right Tibial (AH) Motor  Ankle 2.7  < 5.8 8.1  > 8.0        Knee 12.2 - 7.2 -  Knee-Ankle 42 44  > 41    Sensory Sites    Neg Peak Lat Amplitude (O-P) Segment  Distance Velocity Comment  Site (ms) Norm (V) Norm  (cm) (ms)   Left Superficial Fibular Sensory  14 cm-Ankle 2.1  < 4.4 11  > 6 14 cm-Ankle 14    Right Superficial Fibular Sensory  14 cm-Ankle 2.2  < 4.4 15  > 6 14 cm-Ankle 14    Left Sural Sensory  Calf-Lat mall 3.0  < 4.4 21  > 6 Calf-Lat mall 14    Right Sural Sensory  Calf-Lat mall 2.7  < 4.4 21  > 6 Calf-Lat mall 14     H-Reflex Results    M-Lat H Lat H Neg Amp H-M Lat  Site (ms) (ms) Norm (mV) (ms)  Left Tibial H-Reflex  Pop fossa 5.1 33.1  < 35.0 2.1 28.0  Right Tibial H-Reflex  Pop fossa 5.0 32.1  < 35.0 2.6 27.1   Electromyography   Side Muscle Ins.Act Fibs Fasc Recrt Amp Dur Poly Activation Comment  Right FDL Nml Nml Nml Nml Nml Nml Nml Nml N/A  Right Gastroc MH Nml Nml Nml Nml Nml Nml Nml Nml N/A  Right Tib ant Nml Nml Nml Nml Nml  Nml Nml Nml N/A  Right Vastus lat Nml Nml Nml Nml Nml Nml Nml Nml N/A  Right Biceps SH Nml Nml Nml Nml Nml Nml Nml Nml N/A  Left Biceps SH Nml Nml Nml Nml Nml Nml Nml Nml N/A  Left FDL Nml Nml Nml Nml Nml Nml Nml Nml N/A  Left Gastroc MH Nml Nml Nml Nml Nml Nml Nml Nml N/A  Left Tib ant Nml Nml Nml Nml Nml Nml Nml Nml N/A  Left Vastus lat Nml Nml Nml Nml Nml Nml Nml Nml N/A      Waveforms:  Motor           Sensory           H-Reflex

## 2022-10-16 ENCOUNTER — Ambulatory Visit: Payer: BC Managed Care – PPO | Admitting: Neurology

## 2022-10-18 ENCOUNTER — Encounter: Payer: BC Managed Care – PPO | Admitting: Neurology

## 2022-10-25 ENCOUNTER — Encounter: Payer: BC Managed Care – PPO | Admitting: Neurology

## 2022-10-29 ENCOUNTER — Ambulatory Visit: Payer: BC Managed Care – PPO | Admitting: Neurology

## 2022-10-30 ENCOUNTER — Encounter: Payer: BC Managed Care – PPO | Admitting: Neurology

## 2022-10-31 DIAGNOSIS — E291 Testicular hypofunction: Secondary | ICD-10-CM | POA: Diagnosis not present

## 2022-11-07 ENCOUNTER — Ambulatory Visit: Payer: BC Managed Care – PPO | Admitting: Neurology

## 2022-11-30 DIAGNOSIS — E291 Testicular hypofunction: Secondary | ICD-10-CM | POA: Diagnosis not present

## 2023-02-22 DIAGNOSIS — E291 Testicular hypofunction: Secondary | ICD-10-CM | POA: Diagnosis not present

## 2023-02-22 IMAGING — DX DG CHEST 2V
2 series · 2 of 2 positions shown · non-contrast
Comparison: Chest two views 11/25/2019

CLINICAL DATA: Chest pain.  Mid chest pain.  Left arm pain.

EXAM:
CHEST - 2 VIEW

[chest pa]
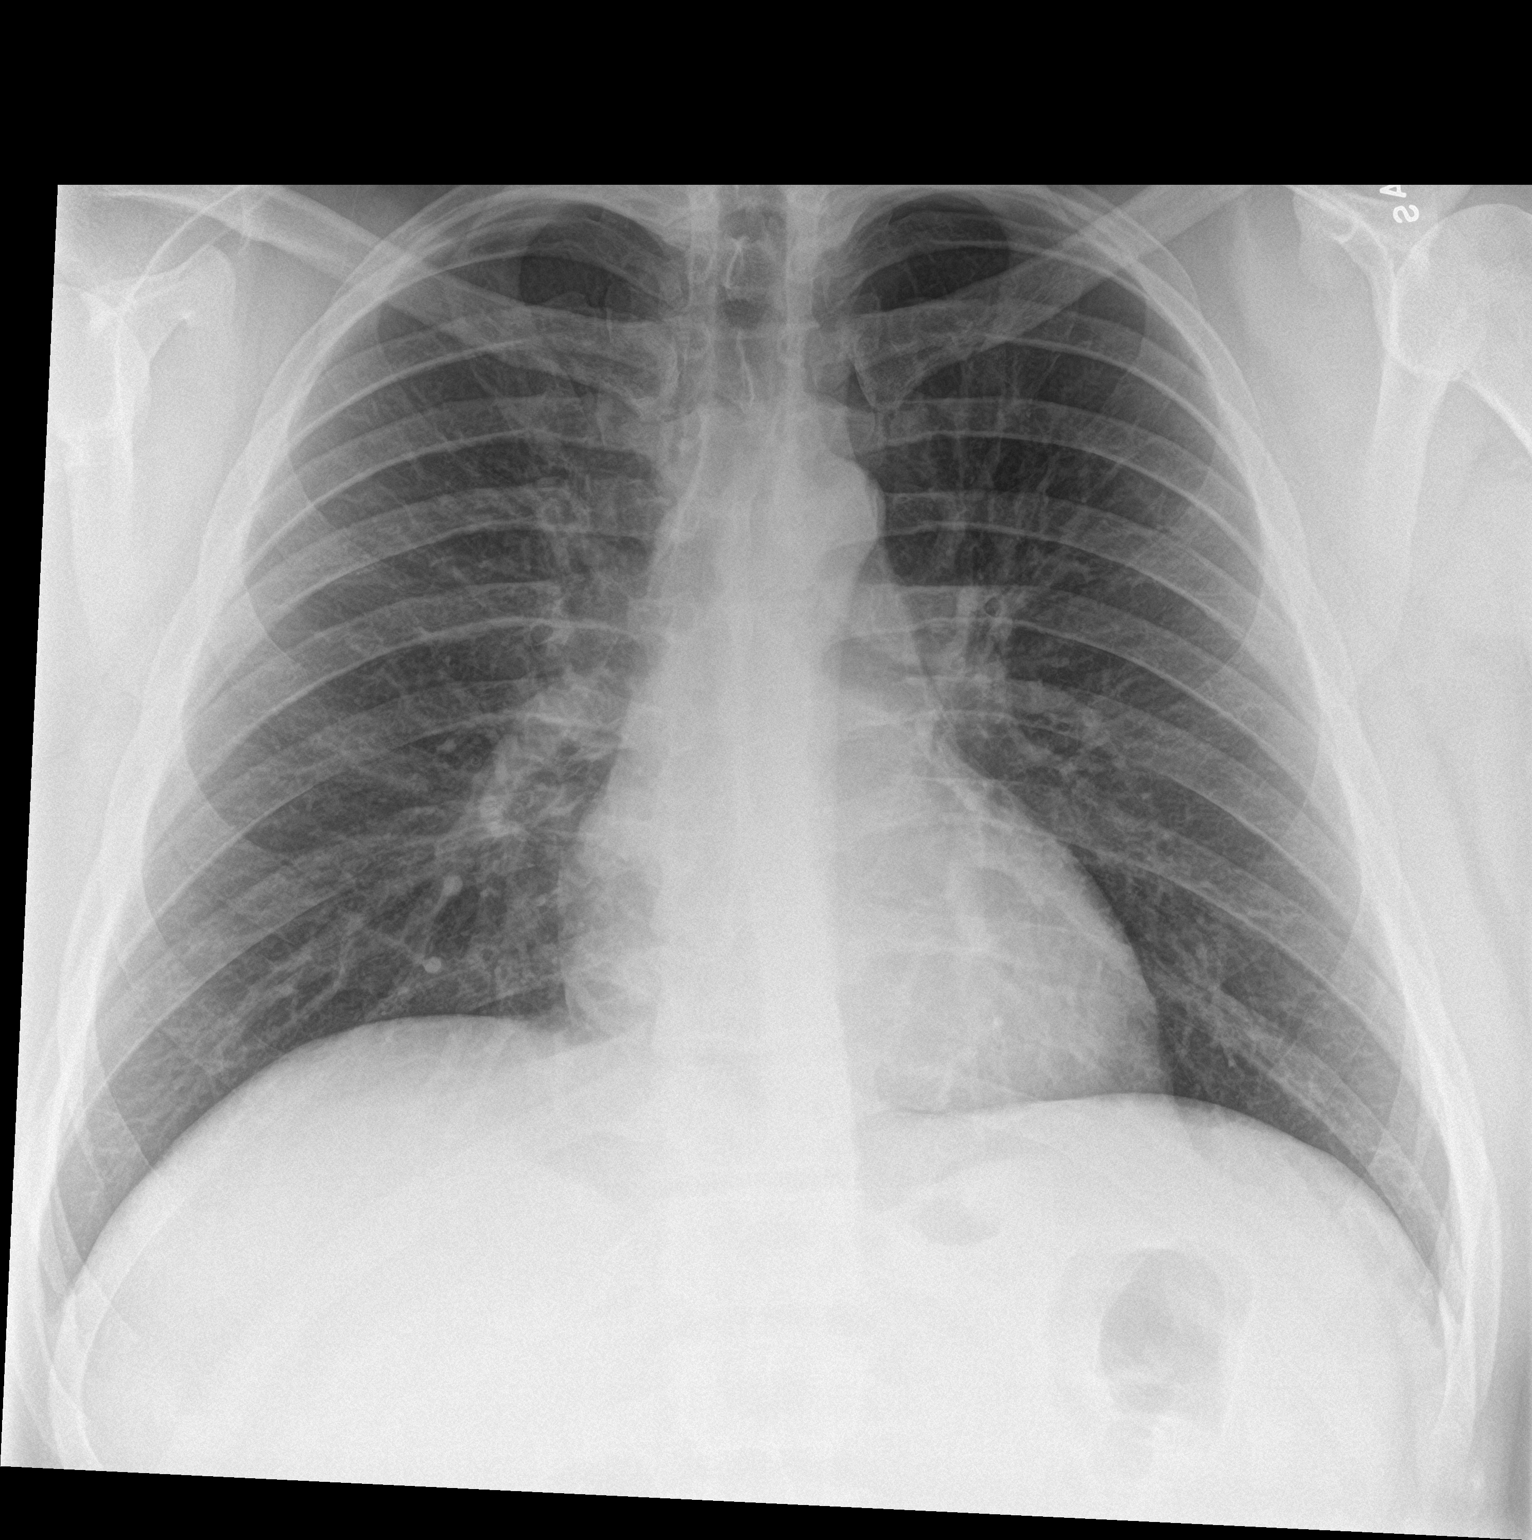

[chest lat]
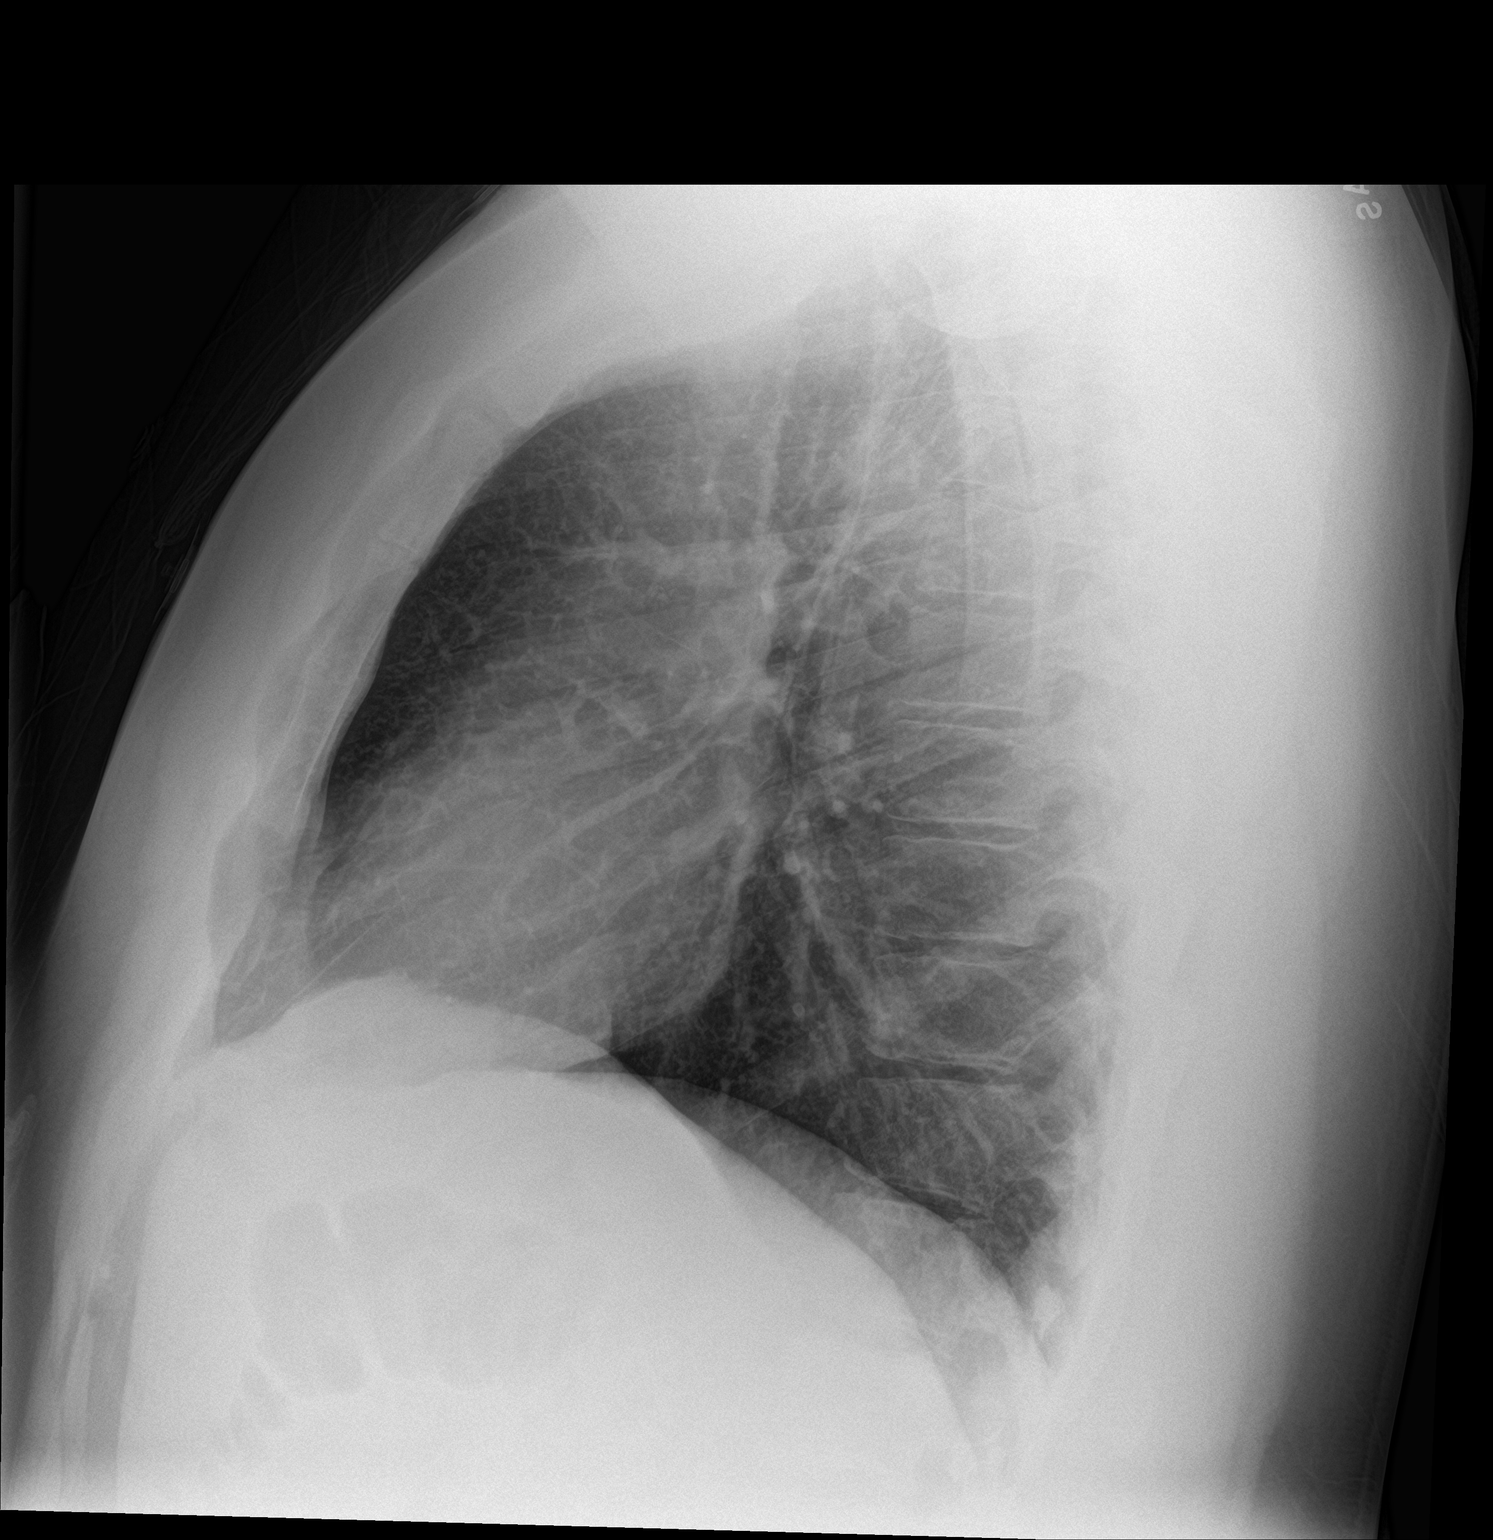

[2 of 2 positions shown; findings below may reference images not displayed]

FINDINGS: Cardiac silhouette and mediastinal contours are within normal
limits. The lungs are clear. No pleural effusion or pneumothorax. No
acute skeletal abnormality.
IMPRESSION: No active cardiopulmonary disease.

## 2023-04-09 DIAGNOSIS — Z23 Encounter for immunization: Secondary | ICD-10-CM | POA: Diagnosis not present

## 2023-05-14 DIAGNOSIS — Z23 Encounter for immunization: Secondary | ICD-10-CM | POA: Diagnosis not present

## 2023-05-20 DIAGNOSIS — H6993 Unspecified Eustachian tube disorder, bilateral: Secondary | ICD-10-CM | POA: Diagnosis not present

## 2023-05-20 DIAGNOSIS — R42 Dizziness and giddiness: Secondary | ICD-10-CM | POA: Diagnosis not present

## 2023-06-22 DIAGNOSIS — Z6841 Body Mass Index (BMI) 40.0 and over, adult: Secondary | ICD-10-CM | POA: Diagnosis not present

## 2023-06-22 DIAGNOSIS — S61412A Laceration without foreign body of left hand, initial encounter: Secondary | ICD-10-CM | POA: Diagnosis not present

## 2023-09-04 DIAGNOSIS — R051 Acute cough: Secondary | ICD-10-CM | POA: Diagnosis not present

## 2023-09-04 DIAGNOSIS — U071 COVID-19: Secondary | ICD-10-CM | POA: Diagnosis not present

## 2024-01-02 DIAGNOSIS — R0789 Other chest pain: Secondary | ICD-10-CM | POA: Diagnosis not present

## 2024-01-10 DIAGNOSIS — E785 Hyperlipidemia, unspecified: Secondary | ICD-10-CM | POA: Diagnosis not present

## 2024-01-10 DIAGNOSIS — E291 Testicular hypofunction: Secondary | ICD-10-CM | POA: Diagnosis not present

## 2024-01-10 DIAGNOSIS — Z0000 Encounter for general adult medical examination without abnormal findings: Secondary | ICD-10-CM | POA: Diagnosis not present

## 2024-01-10 DIAGNOSIS — R7309 Other abnormal glucose: Secondary | ICD-10-CM | POA: Diagnosis not present

## 2024-01-22 ENCOUNTER — Ambulatory Visit: Payer: BC Managed Care – PPO | Attending: Cardiology | Admitting: Cardiology

## 2024-01-22 ENCOUNTER — Encounter: Payer: Self-pay | Admitting: Cardiology

## 2024-01-22 VITALS — BP 143/87 | HR 74 | Resp 16 | Ht 72.0 in | Wt 336.8 lb

## 2024-01-22 DIAGNOSIS — R030 Elevated blood-pressure reading, without diagnosis of hypertension: Secondary | ICD-10-CM

## 2024-01-22 DIAGNOSIS — E66813 Obesity, class 3: Secondary | ICD-10-CM

## 2024-01-22 DIAGNOSIS — Z6841 Body Mass Index (BMI) 40.0 and over, adult: Secondary | ICD-10-CM | POA: Diagnosis not present

## 2024-01-22 DIAGNOSIS — R0789 Other chest pain: Secondary | ICD-10-CM

## 2024-01-22 NOTE — Patient Instructions (Signed)
Medication Instructions:  Your physician recommends that you continue on your current medications as directed. Please refer to the Current Medication list given to you today.  *If you need a refill on your cardiac medications before your next appointment, please call your pharmacy*   Lab Work: none If you have labs (blood work) drawn today and your tests are completely normal, you will receive your results only by: MyChart Message (if you have MyChart) OR A paper copy in the mail If you have any lab test that is abnormal or we need to change your treatment, we will call you to review the results.   Testing/Procedures: Your physician has requested that you have an exercise tolerance test. For further information please visit [email or web address] Please also follow instruction sheet, as given.    Follow-Up: At [facility], you and your health needs are our priority.  As part of our continuing mission to provide you with exceptional heart care, we have created designated Provider Care Teams.  These Care Teams include your primary Cardiologist (physician) and Advanced Practice Providers (APPs -  Physician Assistants and Nurse Practitioners) who all work together to provide you with the care you need, when you need it.  We recommend signing up for the patient portal called "MyChart".  Sign up information is provided on this After Visit Summary.  MyChart is used to connect with patients for Virtual Visits (Telemedicine).  Patients are able to view lab/test results, encounter notes, upcoming appointments, etc.  Non-urgent messages can be sent to your provider as well.   To learn more about what you can do with MyChart, go to [email or web address].    Your next appointment:   As needed  Provider:   Yates Decamp, MD     Other Instructions

## 2024-01-22 NOTE — Progress Notes (Signed)
Cardiology Office Note:  .   Date:  01/22/2024  ID:  Chase Hendricks, DOB 24-Apr-1995, MRN [id number] PCP: Farris Has, MD  [facility] Providers Cardiologist:  Yates Decamp, MD   History of Present Illness: .   Chase Hendricks is a 29 y.o. referred to me for evaluation of chest pain that is ongoing since 2018, patient extremely anxious regarding his frequent episodes of chest pain.  Chest pain located in the middle of the chest, sometimes he feels like it goes to the back and also to his left ring finger.  Not exacerbated by exertion or activity.  Sometimes can last for several hours and sometimes only a few seconds, mostly noted during routine activity or resting and not necessarily with exertion activity.  He also has occasional episodes of GERD for which he takes Tums.  Has been concerned about his weight and recurrent episodes of chest pain and wanted to be further evaluated.  Discussed the use of AI scribe software for clinical note transcription with the patient, who gave verbal consent to proceed.  History of Present Illness   Chase Hendricks, a patient with a history of GERD, presents with recurring chest pains that have been ongoing since the summer of 2018. The pain is described as sharp and sometimes radiates down his left arm. These symptoms have previously resulted in a hospital visit and a recent visit to a walk-in clinic. Despite being told that the chest pains are a result of his GERD, Chase Hendricks is not convinced and is seeking further evaluation. The chest pains are random in occurrence and can last from a few minutes to up to an hour. They are not associated with any specific activity and can occur while Chase Hendricks is at rest. He has not found any specific measures that provide relief, and usually just waits for the pain to pass. Chase Hendricks also reports a history of obesity, with significant weight gain after getting married. He has made attempts to lose weight in the past, but has struggled with  maintaining a consistent exercise routine and controlling his eating habits.      Labs   No results found for: "CHOL", "HDL", "LDLCALC", "LDLDIRECT", "TRIG", "CHOLHDL" Lab Results  Component Value Date   NA 136 03/23/2022   K 3.8 03/23/2022   CO2 24 03/23/2022   GLUCOSE 104 (H) 03/23/2022   BUN 11 03/23/2022   CREATININE 1.06 03/23/2022   CALCIUM 9.3 03/23/2022   GFRNONAA >60 03/23/2022      Latest Ref Rng & Units 03/23/2022    1:59 PM  BMP  Glucose 70 - 99 mg/dL 981   BUN 6 - 20 mg/dL 11   Creatinine 1.91 - 1.24 mg/dL 4.78   Sodium 295 - 621 mmol/L 136   Potassium 3.5 - 5.1 mmol/L 3.8   Chloride 98 - 111 mmol/L 102   CO2 22 - 32 mmol/L 24   Calcium 8.9 - 10.3 mg/dL 9.3       Latest Ref Rng & Units 03/23/2022    1:59 PM  CBC  WBC 4.0 - 10.5 K/uL 10.9   Hemoglobin 13.0 - 17.0 g/dL 30.8   Hematocrit 65.7 - 52.0 % 47.6   Platelets 150 - 400 K/uL 305    External Labs:  Labs 01/10/2024:  Total cholesterol 217, triglycerides 171, HDL 45, LDL 141.  Hb 15.9/HCT 46.3, platelets 304.  A1c 5.4%.  Review of Systems  Cardiovascular:  Positive for chest pain. Negative for dyspnea on exertion and leg  swelling.   Physical Exam:   VS:  BP (!) 143/87 (BP Location: Right Arm, Patient Position: Sitting, Cuff Size: Normal)   Pulse 74   Resp 16   Ht 6' (1.829 m)   Wt (!) 336 lb 12.8 oz (152.8 kg)   SpO2 98%   BMI 45.68 kg/m    Wt Readings from Last 3 Encounters:  01/22/24 (!) 336 lb 12.8 oz (152.8 kg)  09/10/22 (!) 314 lb (142.4 kg)  03/23/22 (!) 320 lb (145.2 kg)   Physical Exam Constitutional:      Appearance: He is obese.  Neck:     Vascular: No carotid bruit or JVD.  Cardiovascular:     Rate and Rhythm: Normal rate and regular rhythm.     Pulses: Intact distal pulses.     Heart sounds: Normal heart sounds. No murmur heard.    No gallop.  Pulmonary:     Effort: Pulmonary effort is normal.     Breath sounds: Normal breath sounds.  Abdominal:     General: Bowel  sounds are normal.     Palpations: Abdomen is soft.  Musculoskeletal:     Right lower leg: No edema.     Left lower leg: No edema.    Studies Reviewed: .    NA EKG:    PCP EKG 01/02/2024: Normal sinus rhythm at rate of 78 bpm, normal EKG.  Medications and allergies    Allergies  Allergen Reactions   Egg-Derived Products Other (See Comments)    Intolerance     Current Outpatient Medications:    acetaminophen (TYLENOL) 500 MG tablet, Take 500 mg by mouth every 6 (six) hours as needed., Disp: , Rfl:    alum & mag hydroxide-simeth (MAALOX MAX) 400-400-40 MG/5ML suspension, Take 10 mLs by mouth every 8 (eight) hours as needed for indigestion (heart burn)., Disp: 355 mL, Rfl: 0   cetirizine (ZYRTEC) 10 MG tablet, Take 10 mg by mouth daily., Disp: , Rfl:    ibuprofen (ADVIL,MOTRIN) 200 MG tablet, Take 200 mg by mouth every 6 (six) hours as needed., Disp: , Rfl:    ASSESSMENT AND PLAN: .      ICD-10-CM   1. Chest pain, musculoskeletal  R07.89 EXERCISE TOLERANCE TEST (ETT)    Informed Consent Details: Physician/Practitioner Attestation; Transcribe to consent form and obtain patient signature    CANCELED: EKG 12-Lead    2. Elevated BP without diagnosis of hypertension  R03.0     3. Class 3 severe obesity due to excess calories without serious comorbidity with body mass index (BMI) of 45.0 to 49.9 in adult [facility])  Z61.096    Z68.42    E66.01      Assessment and Plan    Chest Pain Sharp, central chest pain with radiation to left hand, lasting up to an hour. No clear triggers or relievers. Previous diagnosis of GERD. EKG normal. No exertional symptoms.  Do not suspect angina pectoris.  Just last week he did extremes of exertion trying to place some electrical wires in the [place] and had to crawl and climb up a ladder and had no difficulty.  Chest pain symptoms mostly occurring during rest or complete activities.  To reassure we will schedule for treadmill stress test.  -Schedule  treadmill stress test for further evaluation.  Obesity Significant weight gain since college, currently at 337 lbs. Acknowledges overeating and snacking as issues. -Refer to [facility] for weight management. -Consider bariatric medications like GLP-1 for weight loss.  Hyperlipidemia LDL cholesterol elevated at 141. -Continue monitoring and lifestyle modifications.  I do not think he needs statin therapy at this point.  Low Testosterone Previous testing showed low levels, improved with exercise but has since declined. -Consider supplementation if weight loss and exercise do not improve levels.  Hypertension Slightly elevated blood pressure noted during visit. -Monitor blood pressure and consider treatment if consistently elevated.  Follow-up -Check labs as needed. -Return as needed after treadmill stress test results if abnormal otherwise follow-up with PCP.  Patient felt reassured.    Signed,  Yates Decamp, MD, [facility] 01/22/2024, 10:27 PM [facility] [address] [place], [place] [address] Phone: [phone]. Fax:  [phone]

## 2024-02-05 ENCOUNTER — Encounter: Payer: Self-pay | Admitting: Cardiology

## 2024-02-05 ENCOUNTER — Ambulatory Visit: Payer: BC Managed Care – PPO | Attending: Cardiology

## 2024-02-05 DIAGNOSIS — R0789 Other chest pain: Secondary | ICD-10-CM | POA: Diagnosis not present

## 2024-02-05 LAB — EXERCISE TOLERANCE TEST
Angina Index: 0
Base ST Depression (mm): 0 mm
Duke Treadmill Score: 8
Estimated workload: 10.1
Exercise duration (min): 8 min
Exercise duration (sec): 0 s
MPHR: 193 {beats}/min
Peak HR: 196 {beats}/min
Percent HR: 101 %
RPE: 17
Rest HR: 74 {beats}/min
ST Depression (mm): 0 mm

## 2024-02-05 NOTE — Progress Notes (Signed)
Normal exercise stress test with good exercise tolerance.  Continue primary prevention.

## 2024-06-22 DIAGNOSIS — T63441A Toxic effect of venom of bees, accidental (unintentional), initial encounter: Secondary | ICD-10-CM | POA: Diagnosis not present

## 2024-07-01 DIAGNOSIS — R202 Paresthesia of skin: Secondary | ICD-10-CM | POA: Diagnosis not present

## 2024-11-07 DIAGNOSIS — R030 Elevated blood-pressure reading, without diagnosis of hypertension: Secondary | ICD-10-CM | POA: Diagnosis not present

## 2024-11-07 DIAGNOSIS — G44209 Tension-type headache, unspecified, not intractable: Secondary | ICD-10-CM | POA: Diagnosis not present

## 2024-11-07 DIAGNOSIS — F439 Reaction to severe stress, unspecified: Secondary | ICD-10-CM | POA: Diagnosis not present
